# Patient Record
Sex: Female | Born: 1985 | Race: Black or African American | Hispanic: No | Marital: Married | Smoking: Current every day smoker
Health system: Southern US, Community
[De-identification: ages and names within clinical notes are randomized; demographics above are authoritative.]

## PROBLEM LIST (undated history)

## (undated) DIAGNOSIS — N83209 Unspecified ovarian cyst, unspecified side: Secondary | ICD-10-CM

## (undated) DIAGNOSIS — B977 Papillomavirus as the cause of diseases classified elsewhere: Secondary | ICD-10-CM

## (undated) DIAGNOSIS — R87629 Unspecified abnormal cytological findings in specimens from vagina: Secondary | ICD-10-CM

## (undated) HISTORY — DX: Papillomavirus as the cause of diseases classified elsewhere: B97.7

## (undated) HISTORY — PX: NO PAST SURGERIES: SHX2092

---

## 2012-02-28 ENCOUNTER — Ambulatory Visit: Payer: BC Managed Care – PPO

## 2012-02-28 ENCOUNTER — Ambulatory Visit (INDEPENDENT_AMBULATORY_CARE_PROVIDER_SITE_OTHER): Payer: BC Managed Care – PPO | Admitting: Family Medicine

## 2012-02-28 VITALS — BP 102/60 | HR 80 | Temp 98.1°F | Resp 16 | Ht 66.5 in | Wt 158.0 lb

## 2012-02-28 DIAGNOSIS — M25562 Pain in left knee: Secondary | ICD-10-CM

## 2012-02-28 DIAGNOSIS — M7632 Iliotibial band syndrome, left leg: Secondary | ICD-10-CM

## 2012-02-28 DIAGNOSIS — M25569 Pain in unspecified knee: Secondary | ICD-10-CM

## 2012-02-28 DIAGNOSIS — M629 Disorder of muscle, unspecified: Secondary | ICD-10-CM

## 2012-02-28 MED ORDER — KETOROLAC TROMETHAMINE 60 MG/2ML IM SOLN
60.0000 mg | Freq: Once | INTRAMUSCULAR | Status: AC
  Administered 2012-02-28: 60 mg via INTRAMUSCULAR

## 2012-02-28 NOTE — Progress Notes (Signed)
Subjective:   Rebecca Herring is a 26 y.o. female who presents with knee pain involving the left knee. Onset was recurrent over the last year . Inciting event: none known. Current symptoms include: pain located anterolateral knee and stiffness. Pain is aggravated by kneeling, squatting, standing and walking. Patient has had no prior knee problems. Evaluation to date: none. Treatment to date: OTC analgesics which are mildly to moderately effective .  The following portions of the patient's history were reviewed and updated as appropriate: allergies, current medications, past family history, past medical history, past social history, past surgical history and problem list.   Review of Systems Pertinent items are noted in HPI.   Objective:   BP 102/60  Pulse 80  Temp 98.1 F (36.7 C) (Oral)  Resp 16  Ht 5' 6.5" (1.689 m)  Wt 158 lb (71.668 kg)  BMI 25.12 kg/m2  SpO2 99%  LMP 02/26/2012 Gen: up in chair, NAD HEENT: NCAT, EOMI, TMs clear bilaterally CV: RRR, no murmurs auscultated PULM: CTAB, no wheezes, rales, rhoncii ABD: S/NT/+ bowel sounds  EXT: 2+ peripheral pulses  Right knee: normal and no effusion, full active range of motion, no joint line tenderness, ligamentous structures intact. Left knee:  Knee: Normal to inspection with no erythema or effusion or obvious bony abnormalities. + TTP across anterolateral knee, predominantly lateral knee. + Pain along iliotibial + pain with flexion and extension.  + pain radiating to L lateral knee with hip abduction.  Ligaments with solid consistent endpoints including ACL, PCL, LCL, MCL. Negative Mcmurray's and provocative meniscal tests. Non painful patellar compression. Patellar and quadriceps tendons unremarkable.   X-ray left knee: no fracture, dislocation, swelling or degenerative changes noted    Assessment:  R knee pain Iliotibial band syndrome.   Plan:   Sxs seem most consistent with iliotibial band syndrome. DDx included  patellofemoral syndrome.  Will place in knee brace. RICE and NSAIDs.  Plan for follow up with sports medicine.  Discussed general care.  Follow up as needed.     The patient and/or caregiver has been counseled thoroughly with regard to treatment plan and/or medications prescribed including dosage, schedule, interactions, rationale for use, and possible side effects and they verbalize understanding. Diagnoses and expected course of recovery discussed and will return if not improved as expected or if the condition worsens. Patient and/or caregiver verbalized understanding

## 2012-02-28 NOTE — Progress Notes (Deleted)
  Subjective:    Patient ID: Rebecca Herring, female    DOB: [birth year], 26 y.o.   MRN: [id number]  HPI    Review of Systems     Objective:   Physical Exam        Assessment & Plan:

## 2012-02-28 NOTE — Patient Instructions (Signed)
Iliotibial Band Syndrome  Iliotibial band syndrome is pain in the outer, upper thigh. The pain is due to an inflammation (soreness) of the iliotibial band. This is a band of thick fibrous tissue that runs down the outside of the thigh. The iliotibial band begins at the hip. It extends to the outer side of the shin bone (tibia) just below the knee joint. The band works with the thigh muscles. Together they provide stability to the outside of the knee joint.  Iliotibial band syndrome (ITBS) occurs when there is inflammation to this band of tissue. The irritation usually occurs over the outside of the knee joint, at the lateral epicondyle (the end of the femur bone.) The iliotibial band crosses bone and muscle at this point. Between these structures is a bursa (a cushioning sac). The bursa should make possible a smooth gliding motion. However, when inflamed, the iliotibial band does not glide easily. When inflamed, there is pain with motion of the knee. Usually the pain worsens with continued movement and the pain goes away with rest.  This problem usually arises when there is a sudden increase in sports activities involving the lower extremities (your legs). Running, and playing soccer or basketball are examples of activities causing this. Others who are prone to ITBS include individuals with mechanical problems such as leg length differences, abnormality of walking, bowed legs etc.  This diagnosis (learning what is wrong) is made by examination. X-rays are usually normal if only soft tissue inflammation is present.  Treatment of ITBS begins with proper footwear, icing the area of pain, stretching, and resting for a period of time. Incorporating low-impact cross-training activities may also help. Your caregiver may prescribe anti-inflammatory medications as well.  HOME CARE INSTRUCTIONS   · Apply ice to the sore area for 15 to 20 minutes, 3 to 4 times per day. Put the ice in a plastic bag and place a towel between the  bag of ice and your skin.  · Limit excessive training or eliminate training until pain goes away.  · While pain is present, you may use gentle range of motion. Do not resume regular use until instructed by your caregiver. Begin use gradually. Do not increase use to the point of pain. If pain does develop, decrease use and continue the above measures. Gradually increase activities that do not cause discomfort. Do this until you finally achieve normal use.  · Perform low impact activities while pain is present. Wear proper footwear.  · Only take over-the-counter or prescription medicines for pain, discomfort, or fever as directed by your caregiver.  SEEK MEDICAL CARE IF:   · Your pain increases or pain is not controlled with medications.  · You develop new, unexplained symptoms (problems), or an increase of the symptoms that brought you to your caregiver.  · Your pain and symptoms are not improving or are getting worse.  Document Released: 10/14/2001 Document Revised: 07/17/2011 Document Reviewed: 04/24/2005  [employer]® Patient Information ©2013 [employer].

## 2012-03-12 ENCOUNTER — Encounter: Payer: Self-pay | Admitting: Family Medicine

## 2012-03-12 ENCOUNTER — Ambulatory Visit (INDEPENDENT_AMBULATORY_CARE_PROVIDER_SITE_OTHER): Payer: BC Managed Care – PPO | Admitting: Family Medicine

## 2012-03-12 VITALS — BP 120/79 | HR 68 | Temp 98.2°F | Ht 66.0 in | Wt 159.1 lb

## 2012-03-12 DIAGNOSIS — M25569 Pain in unspecified knee: Secondary | ICD-10-CM

## 2012-03-12 NOTE — Progress Notes (Signed)
Patient ID: Rebecca Herring, female   DOB: 1986-02-27, 26 y.o.   MRN: [id number]  SUBJECTIVE: Rebecca Herring is a 26 y.o. female with unremarkable PMHx who presents today for left knee pain with referral from urgent care.  Seen there on 28-Feb-2012 and diagnosed with ITB syndrome vs patellofemoral syndrome.  Placed into immobilizer knee brace to wear at work or when bothering her and given prn oral NSAIDs, RICE treatment and referred to sports med.    Pt reports insidious onset of LEFT, anterior-lateral knee pain in approximately February 2013.  She previously ran on treadmill and used ellpitical for exercise approx 3-4 days/week but is currently not active secondary to pain / discomfort.  Pt reports insidious onset of pain and denies any trauma or distinct injury.  Though present since onset, within the last few months she has noticed progressively more common clicking and pain from the knee, specifically lateral aspect.  While at work she has had bouts of instability, causing her to catch herself on the bar or co-workers to prevent fall, and locking.  Reports edema in knee as well, usually worse at the end of long shifts, especially after consecutive days of work.   -- She finally decided to seek medical care @ urgent care after waking up with swollen knee and in a locked, painful position.  After gingerly using her hands to passively move her knee around she was finally able to get to walking position but still found flexion/extension quite difficult.  Denies any radicular symptoms, fever/chills/night sweats.  Denies any localized erythema, warmth on knee at any point.  Placed in knee immobilizer at urgent care which has been little/no help per report.  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  Typically works 3-5 consecutive days per week with Monday,  Tuesday, and sometimes Wednesday off Smokes 1/2 ppd since 26 years old Social EtOH use Monogamous with one partner; no illicit drug use   OBJECTIVE: Vital signs as noted above. Well-appearing female in no acute distress,   Knee exam: - No gross abnormalities on inspection; no erythema, edema about knee; cool to touch - AROM, PROM symmetric and appropriate in flexion, extension.  Hip abduction, adduction symmetric and appropriate - 5/5 strength in knee flexion and extension.  Some pain with resisted flexion but 5/5 strength on LEFT knee - Hip abduction and adduction, flexion and extension all 5/5 strength bilaterally; NO increased pain with resisted hip abduction - On palpation there is lateral joint line tenderness along the LEFT knee, worse at lateral, anterior joint line; no medial joint line tenderness on LEFT; none on RIGHT anywhere - Negative Lachman's, anterior / posterior drawer bilaterally - Negative Valgus stress testing bilaterally; however, during Valgus stress testing patient reports pain along lateral joint line, especially in 30 degrees knee flexion - Varus stress test has firm endpoint but is slightly lax bilaterally - McMurray's reveals bilateral patellar crepitus but negative for catching, clicking or locking though the patient reports pain on LEFT and has difficulty relaxing - Positive thessaly test on LEFT leg at 5 degrees, worse at 30 degrees with experience of popping, pain and pt reports she thinks it would lock/catch if she twisted to hard - Negative Ober's test bilaterally; negative thomas test  Left knee U/S: Lateral joint line visualized on musculoskeletal ultrasound in clinic.  Superficial lateral meniscus is visualized but there is  hypoechoic area surrounding lateral joint line, within knee capsule consistent with edema.  This edema is seen in longitudinal and trans views.  Medial joint line visualized and was without abnormality.  Patellar tendon visualized in  transverse and long views, without abnormality.  Suprapatellar pouch visualized in transverse view with no appreciable edema.  ASSESSMENT:  1. LEFT anterior-lateral knee pain, suspect meniscal injury  PLAN: 26 year old female with anterior lateral knee joint pain, swelling, popping, locking and instability that is affecting her ability to perform at work and her day to day function.  Denies any specific trauma or event and reports insidious onset with progressively worsening course developing into the above symptoms.  Physical exam suggestive of lateral meniscal injury vs. ITB syndrome though given characteristics of occasional instability, locking and edema in otherwise healthy young adult female with positive thessaly test, would like to proceed with MRI to rule out meniscal injury.   Pt had MRI scheduled in the office today and will go for imaging.  Someone from [facility] will call her back with MRI results.  MRI results influence follow-up and plan of care, would likely pursue surgical fixation if true meniscal injury. In interim patient was given body helix sleeve to wear over knee for support, comfort while at work and for 1 hour afterward.  Pt also instructed on how to perform, and had provider demonstration of various quadricept, hip flexor and knee extensor exercises to strengthen adjacent muscles and protect knee.  Patient did have x-rays done in urgent care with her reviewed by me today. Patient's x-rays show no gross abnormality and no sign of loose body.

## 2012-03-12 NOTE — Patient Instructions (Addendum)
Thank you for coming to the office today  - You were given a body helix knee sleeve.  Please wear this whenever you are at work for comfort and for 1 hour afterward to help keep edema out of your knee  - You were given a series of quadricep strengthening exercises to complete so you further strengthen your knee and protect that joint, improve function  - You will be called with your MRI appointment.  We will follow-up with you based on the results of your MRI.

## 2012-03-16 ENCOUNTER — Other Ambulatory Visit: Payer: BC Managed Care – PPO

## 2012-10-09 ENCOUNTER — Ambulatory Visit (INDEPENDENT_AMBULATORY_CARE_PROVIDER_SITE_OTHER): Payer: Managed Care, Other (non HMO) | Admitting: Family Medicine

## 2012-10-09 VITALS — BP 110/82 | HR 75 | Temp 98.2°F | Resp 16 | Ht 66.0 in | Wt 161.0 lb

## 2012-10-09 DIAGNOSIS — Z0000 Encounter for general adult medical examination without abnormal findings: Secondary | ICD-10-CM

## 2012-10-09 DIAGNOSIS — N898 Other specified noninflammatory disorders of vagina: Secondary | ICD-10-CM

## 2012-10-09 DIAGNOSIS — F17200 Nicotine dependence, unspecified, uncomplicated: Secondary | ICD-10-CM

## 2012-10-09 DIAGNOSIS — K649 Unspecified hemorrhoids: Secondary | ICD-10-CM

## 2012-10-09 DIAGNOSIS — A630 Anogenital (venereal) warts: Secondary | ICD-10-CM

## 2012-10-09 DIAGNOSIS — Z720 Tobacco use: Secondary | ICD-10-CM

## 2012-10-09 LAB — POCT CBC
Granulocyte percent: 44.1 % (ref 37–80)
HCT, POC: 41.6 % (ref 37.7–47.9)
Hemoglobin: 13.1 g/dL (ref 12.2–16.2)
Lymph, poc: 3 (ref 0.6–3.4)
MCH, POC: 30.8 pg (ref 27–31.2)
MCHC: 31.5 g/dL — AB (ref 31.8–35.4)
MCV: 97.9 fL — AB (ref 80–97)
MID (cbc): 0.5 (ref 0–0.9)
MPV: 8.9 fL (ref 0–99.8)
POC Granulocyte: 2.7 (ref 2–6.9)
POC LYMPH PERCENT: 48.4 %L (ref 10–50)
POC MID %: 7.5 %M (ref 0–12)
Platelet Count, POC: 262 10*3/uL (ref 142–424)
RBC: 4.25 M/uL (ref 4.04–5.48)
RDW, POC: 13.5 %
WBC: 6.1 10*3/uL (ref 4.6–10.2)

## 2012-10-09 LAB — COMPREHENSIVE METABOLIC PANEL
ALT: <8 U/L (ref 0–35)
Alkaline Phosphatase: 40 U/L (ref 39–117)
CO2: 25 mEq/L (ref 19–32)
Creat: 0.82 mg/dL (ref 0.50–1.10)
Glucose, Bld: 74 mg/dL (ref 70–99)
Sodium: 138 mEq/L (ref 135–145)
Total Bilirubin: 0.9 mg/dL (ref 0.3–1.2)
Total Protein: 6.9 g/dL (ref 6.0–8.3)

## 2012-10-09 LAB — COMPREHENSIVE METABOLIC PANEL WITH GFR
AST: 15 U/L (ref 0–37)
Albumin: 4.6 g/dL (ref 3.5–5.2)
BUN: 7 mg/dL (ref 6–23)
Calcium: 9.2 mg/dL (ref 8.4–10.5)
Chloride: 106 meq/L (ref 96–112)
Potassium: 4.2 meq/L (ref 3.5–5.3)

## 2012-10-09 LAB — POCT WET PREP WITH KOH
Clue Cells Wet Prep HPF POC: NEGATIVE
KOH Prep POC: NEGATIVE
Trichomonas, UA: NEGATIVE
Yeast Wet Prep HPF POC: NEGATIVE

## 2012-10-09 LAB — IFOBT (OCCULT BLOOD): IFOBT: NEGATIVE

## 2012-10-09 MED ORDER — VARENICLINE TARTRATE 0.5 MG X 11 & 1 MG X 42 PO MISC: ORAL | Status: AC

## 2012-10-09 MED ORDER — IMIQUIMOD 5 % EX CREA: TOPICAL_CREAM | CUTANEOUS | Status: DC

## 2012-10-09 NOTE — Patient Instructions (Signed)
Hemorrhoids Hemorrhoids are puffy (swollen) veins around the rectum or anus. Hemorrhoids can cause pain, itching, bleeding, or irritation. HOME CARE  Eat foods with fiber, such as whole grains, beans, nuts, fruits, and vegetables. Ask your doctor about taking products with added fiber in them (fibersupplements).  Drink enough fluid to keep your pee (urine) clear or pale yellow.  Exercise often.  Go to the bathroom when you have the urge to poop. Do not wait.  Avoid straining to poop (bowel movement).  Keep the butt area dry and clean. Use wet toilet paper or moist paper towels.  Medicated creams and medicine inserted into the anus (anal suppository) may be used or applied as told.  Only take medicine as told by your doctor.  Take a warm water bath (sitz bath) for 15 20 minutes to ease pain. Do this 3 4 times a day.  Place ice packs on the area if it is tender or puffy. Use the ice packs between the warm water baths.  Put ice in a plastic bag.  Place a towel between your skin and the bag.  Leave the ice on for 15-20 minutes, 3-4 times a day.  Do not use a donut-shaped pillow or sit on the toilet for a long time. GET HELP RIGHT AWAY IF:   You have more pain that is not controlled by treatment or medicine.  You have bleeding that will not stop.  You have trouble or are unable to poop (bowel movement).  You have pain or puffiness outside the area of the hemorrhoids. MAKE SURE YOU:   Understand these instructions.  Will watch your condition.  Will get help right away if you are not doing well or get worse. Document Released: 02/01/2008 Document Revised: 04/10/2012 Document Reviewed: 03/05/2012 [facility] Patient Information 2014 [place], [place]. Human Papillomavirus Human papillomavirus (HPV) is the most common sexually transmitted infection (STI) and is highly contagious. HPV infections cause genital warts and cancers to the outlet of the womb (cervix), birth canal  (vagina), opening of the birth canal (vulva), and anus. There are over 100 types of HPV. Four types of HPV are responsible for causing 70% of all cervical cancers. Ninety percent of anal cancers and genital warts are caused by HPV. Unless you have wart-like lesions in the throat or genital warts that you can see or feel, HPV usually does not cause symptoms. Therefore, people can be infected for long periods and pass it on to others without knowing it. HPV in pregnancy usually does not cause a problem for the mother or baby. If the mother has genital warts, the baby rarely gets infected. When the HPV infection is found to be pre-cancerous on the cervix, vagina, or vulva, the mother will be followed closely during the pregnancy. Any needed treatment will be done after the baby is born. CAUSES   Having unprotected sex. HPV can be spread by oral, vaginal, or anal sexual activity.  Having several sex partners.  Having a sex partner who has other sex partners.  Having or having had another sexually transmitted infection. SYMPTOMS   More than 90% of people carrying HPV cannot tell anything is wrong.  Wart-like lesions in the throat (from having oral sex).  Warts in the infected skin or mucous membranes.  Genital warts may itch, burn, or bleed.  Genital warts may be painful or bleed during sexual intercourse. DIAGNOSIS   Genital warts are easily seen with the naked eye.  Currently, there is no FDA-approved test to detect  HPV in males.  In females, a Pap test can show cells which are infected with HPV.  In females, a scope can be used to view the cervix (colposcopy). A colposcopy can be performed if the pelvic exam or Pap test is abnormal.  In females, a sample of tissue may be removed (biopsy) during the colposcopy. TREATMENT   Treatment of genital warts can include:  Podophyllin lotion or gel.  Bichloroacetic acid (BCA) or trichloroacetic acid (TCA).  Podofilox solution or  gel.  Imiquimod cream.  Interferon injections.  Use of a probe to apply extreme cold (cryotherapy).  Application of an intense beam of light (laser treatment).  Use of a probe to apply extreme heat (electrocautery).  Surgery.  HPV of the cervix, vagina, or vulva can be treated with:  Cryotherapy.  Laser treatment.  Electrocautery.  Surgery. Your caregiver will follow you closely after you are treated. This is because the HPV can come back and may need treatment again. HOME CARE INSTRUCTIONS   Follow your caregiver's instructions regarding medications, Pap tests, and follow-up exams.  Do not touch or scratch the warts.  Do not treat genital warts with medication used for treating hand warts.  Tell your sex partner about your infection because he or she may also need treatment.  Do not have sex while you are being treated.  After treatment, use condoms during sex to prevent future infections.  Have only 1 sex partner.  Have a sex partner who does not have other sex partners.  Use over-the-counter creams for itching or irritation as directed by your caregiver.  Use over-the-counter or prescription medicines for pain, discomfort, or fever as directed by your caregiver.  Do not douche or use tampons during treatment of HPV. PREVENTION   Talk to your caregiver about getting the HPV vaccines. These vaccines prevent some HPV infections and cancers. It is recommended that the vaccine be given to males and females between the age of 60 and 38 years old. It will not work if you already have HPV and it is not recommended for pregnant women. The vaccines are not recommended for pregnant women.  Call your caregiver if you think you are pregnant and have the HPV.  A PAP test is done to screen for cervical cancer.  The first PAP test should be done at age 52.  Between ages 22 and 42, PAP tests are repeated every 2 years.  Beginning at age 58, you are advised to have a PAP  test every 3 years as long as your past 3 PAP tests have been normal.  Some women have medical problems that increase the chance of getting cervical cancer. Talk to your caregiver about these problems. It is especially important to talk to your caregiver if a new problem develops soon after your last PAP test. In these cases, your caregiver may recommend more frequent screening and Pap tests.  The above recommendations are the same for women who have or have not gotten the vaccine for HPV (Human Papillomavirus).  If you had a hysterectomy for a problem that was not a cancer or a condition that could lead to cancer, then you no longer need Pap tests. However, even if you no longer need a PAP test, a regular exam is a good idea to make sure no other problems are starting.   If you are between ages 39 and 37, and you have had normal Pap tests going back 10 years, you no longer need Pap tests. However,  even if you no longer need a PAP test, a regular exam is a good idea to make sure no other problems are starting.  If you have had past treatment for cervical cancer or a condition that could lead to cancer, you need Pap tests and screening for cancer for at least 20 years after your treatment.  If Pap tests have been discontinued, risk factors (such as a new sexual partner)need to be re-assessed to determine if screening should be resumed.  Some women may need screenings more often if they are at high risk for cervical cancer. SEEK MEDICAL CARE IF:   The treated skin becomes red, swollen or painful.  You have an oral temperature above 102 F (38.9 C).  You feel generally ill.  You feel lumps or pimple-like projections in and around your genital area.  You develop bleeding of the vagina or the treatment area.  You develop painful sexual intercourse. Document Released: 07/15/2003 Document Revised: 07/17/2011 Document Reviewed: 07/04/2007 [facility] Patient Information 2014 [place],  [place].

## 2012-10-09 NOTE — Progress Notes (Signed)
[facility]:  Office Visit  Chief Complaint:  Chief Complaint  Patient presents with  . Rectal Bleeding    x 4 days, getting worse, no pain though  . Genital Warts    just has general questions regarding HPV warts    HPI: Rebecca Herring is a 27 y.o. female who complains of:  Would like annual exam-last annual May 2013, + abnormal pap for HPV, does not do SBE, no family h/o ovarian, uterine, breast cancer  1 month ago  She had swelling in her rectal area, no itching or burning. This past week she saw blood in her stool and also in the toilet. Yesterday she had dripping bright red blood in the toilet and stool and when she wiped on toilet paper. She states she does not have to strain but has constipation. No h/o fevers, chill, weight loss, diarrhea, night sweats, n/v/abd apin. Denies family h/o UC, IBS, colitis, colon cancer.   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 Husbnad has "molds on his scrotum and she has it around her vagina". G0, + HPV, LMP 09/26/12. Regular,Normal flow. No other STDs  Past Medical History  Diagnosis Date  . HPV (human papilloma virus) infection    History reviewed. No pertinent past surgical history. History   Social History  . Marital Status: Married    Spouse Name: N/A    Number of Children: N/A  . Years of Education: N/A   Social History Main Topics  . Smoking status: Current Every Day Smoker -- 0.50 packs/day    Types: Cigarettes  . Smokeless tobacco: None  . Alcohol Use: Yes  . Drug Use: No  . Sexually Active: None   Other Topics Concern  . None   Social History Narrative  . None   History reviewed. No pertinent family history. No Known Allergies Prior to Admission medications   Not on File     ROS: The patient denies fevers, chills, night sweats, unintentional weight loss, chest pain, palpitations, wheezing, dyspnea on exertion, nausea, vomiting, abdominal pain, dysuria, hematuria,  numbness,  weakness, or tingling. + rectal bleeding  All other systems have been reviewed and were otherwise negative with the exception of those mentioned in the HPI and as above.    PHYSICAL EXAM: Filed Vitals:   10/09/12 1426  BP: 110/82  Pulse: 75  Temp: 98.2 F (36.8 C)  Resp: 16   Filed Vitals:   10/09/12 1426  Height: 5\' 6"  (1.676 m)  Weight: 161 lb (73.029 kg)   Body mass index is 26 kg/(m^2).  General: Alert, no acute distress HEENT:  Normocephalic, atraumatic, oropharynx patent.  Cardiovascular:  Regular rate and rhythm, no rubs murmurs or gallops.  No Carotid bruits, radial pulse intact. No pedal edema.  Respiratory: Clear to auscultation bilaterally.  No wheezes, rales, or rhonchi.  No cyanosis, no use of accessory musculature GI: No organomegaly, abdomen is soft and non-tender, positive bowel sounds.  No masses. Skin: No rashes. Neurologic: Facial musculature symmetric. Psychiatric: Patient is appropriate throughout our interaction. Lymphatic: No cervical lymphadenopathy Musculoskeletal: Gait intact. Breast -normal GU-+ genital warts perivaginal, non on labia, + large external hemorrhoid no thrombosed, no rectal masses on lesions inside  rectal exam   LABS: Results for orders placed in visit on 10/09/12  POCT CBC      Result Value Range   WBC 6.1  4.6 - 10.2 K/uL   Lymph, poc 3.0  0.6 - 3.4  POC LYMPH PERCENT 48.4  10 - 50 %L   MID (cbc) 0.5  0 - 0.9   POC MID % 7.5  0 - 12 %M   POC Granulocyte 2.7  2 - 6.9   Granulocyte percent 44.1  37 - 80 %G   RBC 4.25  4.04 - 5.48 M/uL   Hemoglobin 13.1  12.2 - 16.2 g/dL   HCT, POC 16.1  09.6 - 47.9 %   MCV 97.9 (*) 80 - 97 fL   MCH, POC 30.8  27 - 31.2 pg   MCHC 31.5 (*) 31.8 - 35.4 g/dL   RDW, POC 04.5     Platelet Count, POC 262  142 - 424 K/uL   MPV 8.9  0 - 99.8 fL  POCT WET PREP WITH KOH      Result Value Range   Trichomonas, UA Negative     Clue Cells Wet Prep HPF POC neg     Epithelial Wet Prep HPF POC 8-15      Yeast Wet Prep HPF POC neg     Bacteria Wet Prep HPF POC 3+     RBC Wet Prep HPF POC 0-1     WBC Wet Prep HPF POC 2-6     KOH Prep POC Negative    IFOBT (OCCULT BLOOD)      Result Value Range   IFOBT Negative       EKG/XRAY:   Primary read interpreted by Dr. Conley Rolls at [facility].   ASSESSMENT/PLAN: Encounter Diagnoses  Name Primary?  . Annual physical exam Yes  . Vaginal venereal warts   . Vaginal discharge   . Hemorrhoids   . Tobacco abuse    Annual PE-labs pending + hemorrhoids-gave directions on hemorrhoid care Genital warts-Rx Aldara Tobacco cessation- Rx Chantix, Denies SI/HI Gross sideeffects, risk and benefits, and alternatives of medications d/w patient. Patient is aware that all medications have potential sideeffects and we are unable to predict every sideeffect or drug-drug interaction that may occur. F/u prn   Tran Randle PHUONG, DO 10/09/2012 3:52 PM

## 2012-10-11 LAB — PAP IG, CT-NG, RFX HPV ASCU
Chlamydia Probe Amp: NEGATIVE
GC Probe Amp: NEGATIVE

## 2012-10-14 ENCOUNTER — Other Ambulatory Visit: Payer: Self-pay | Admitting: Family Medicine

## 2012-10-14 ENCOUNTER — Telehealth: Payer: Self-pay | Admitting: Family Medicine

## 2012-10-14 DIAGNOSIS — R87619 Unspecified abnormal cytological findings in specimens from cervix uteri: Secondary | ICD-10-CM

## 2012-10-14 DIAGNOSIS — IMO0002 Reserved for concepts with insufficient information to code with codable children: Secondary | ICD-10-CM

## 2012-10-14 NOTE — Telephone Encounter (Signed)
LM to call me back. Would like to discuss her pap smear results.

## 2012-10-14 NOTE — Telephone Encounter (Signed)
D/w patient labs and pap. Refer to Ob/Gyn for abnormal pap

## 2013-01-08 ENCOUNTER — Ambulatory Visit: Payer: BC Managed Care – PPO | Admitting: Obstetrics

## 2013-05-10 IMAGING — CR DG KNEE COMPLETE 4+V*L*
4 series · 4 of 4 positions shown · non-contrast
Comparison: Preliminary reading Dr. Sanchez

CLINICAL DATA: Evaluate for degenerative changes

LEFT KNEE - COMPLETE 4+ VIEW

[AP]
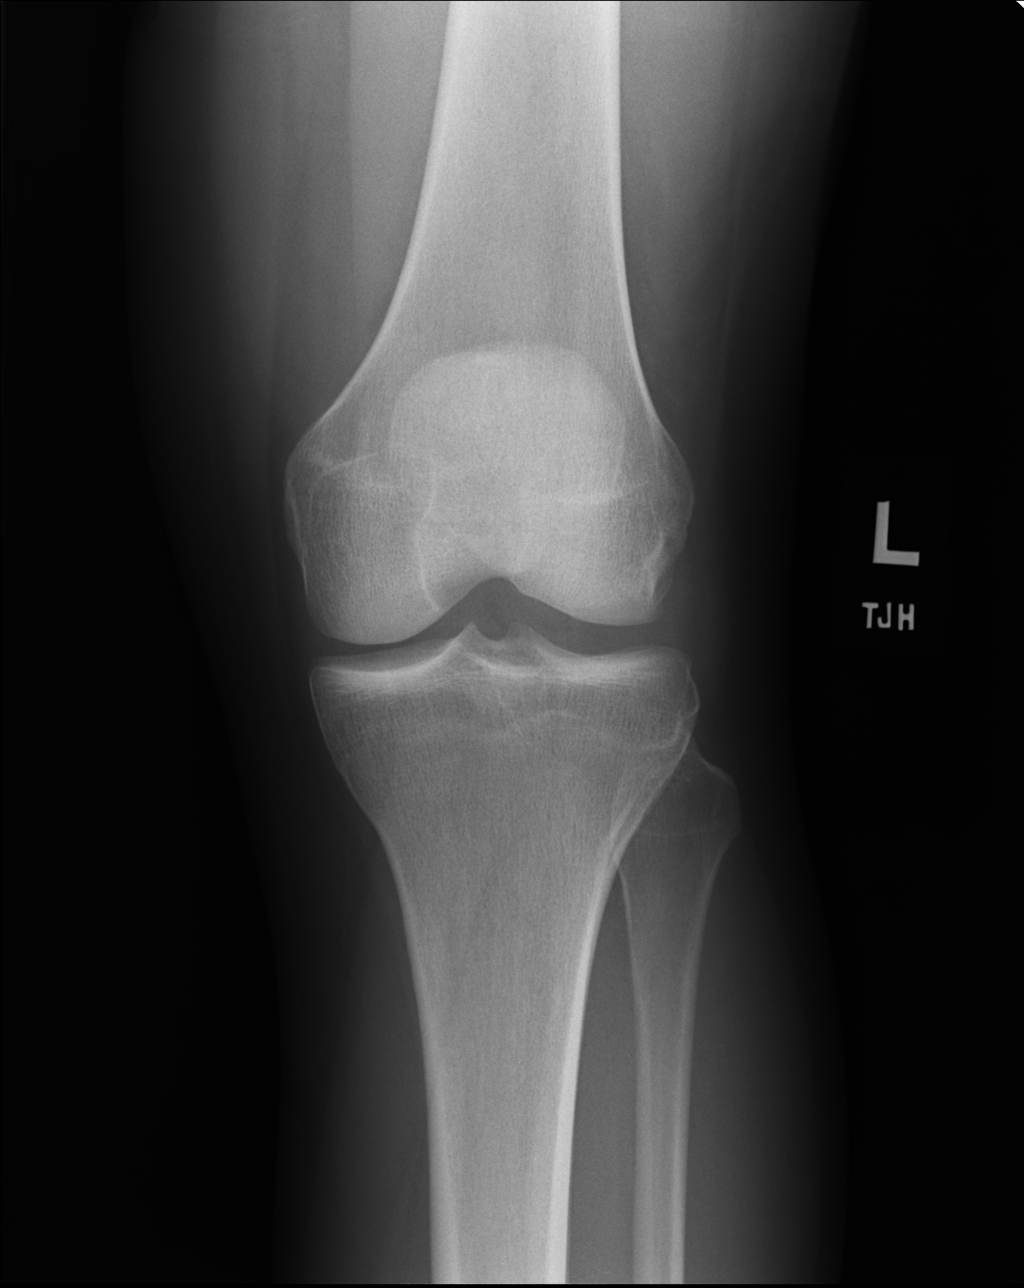

[lateral]
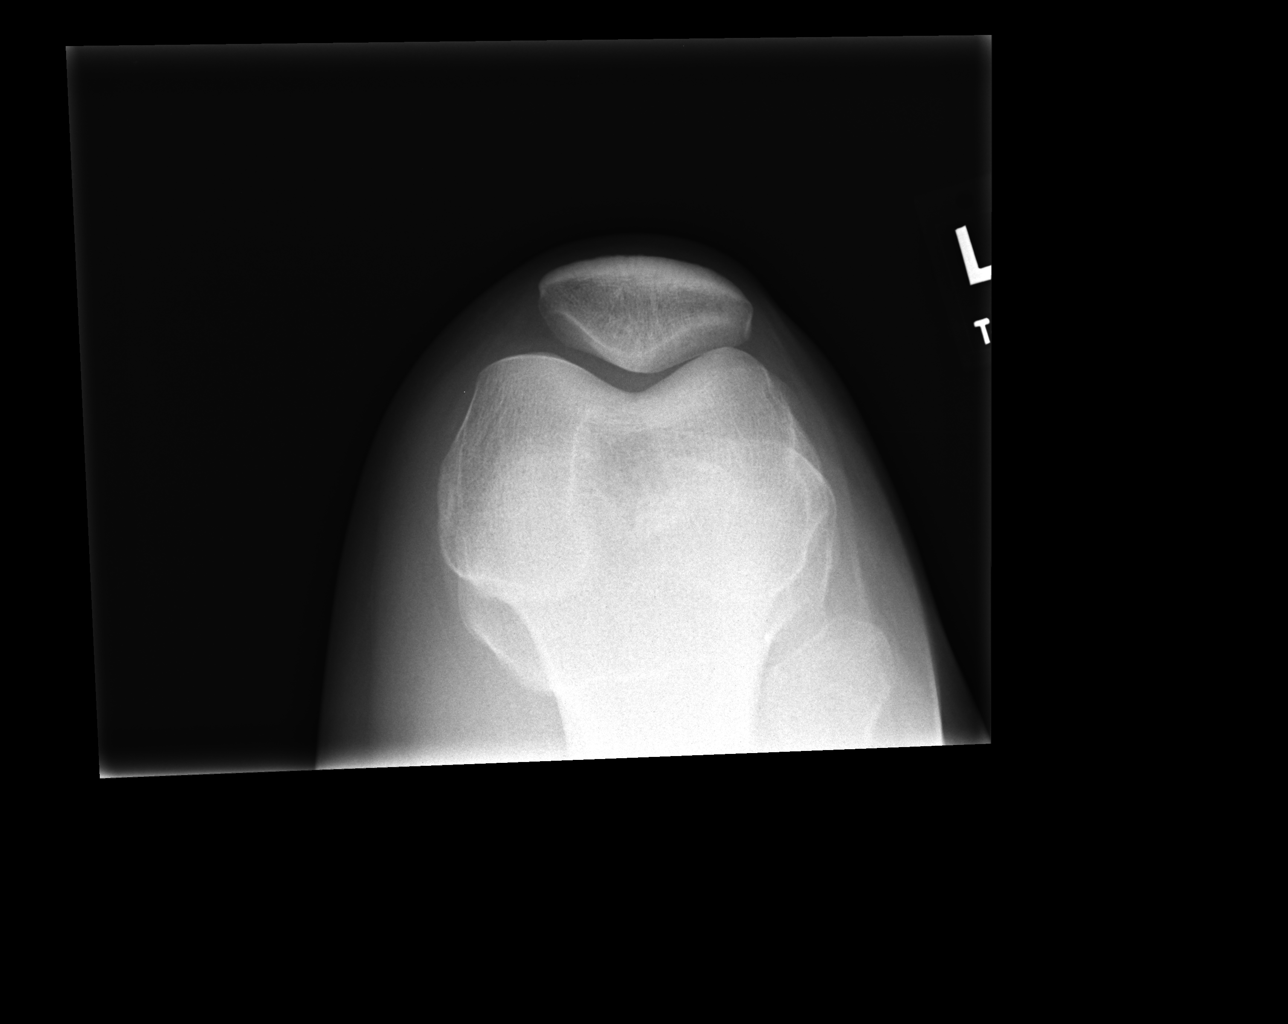

[ap ext rot]
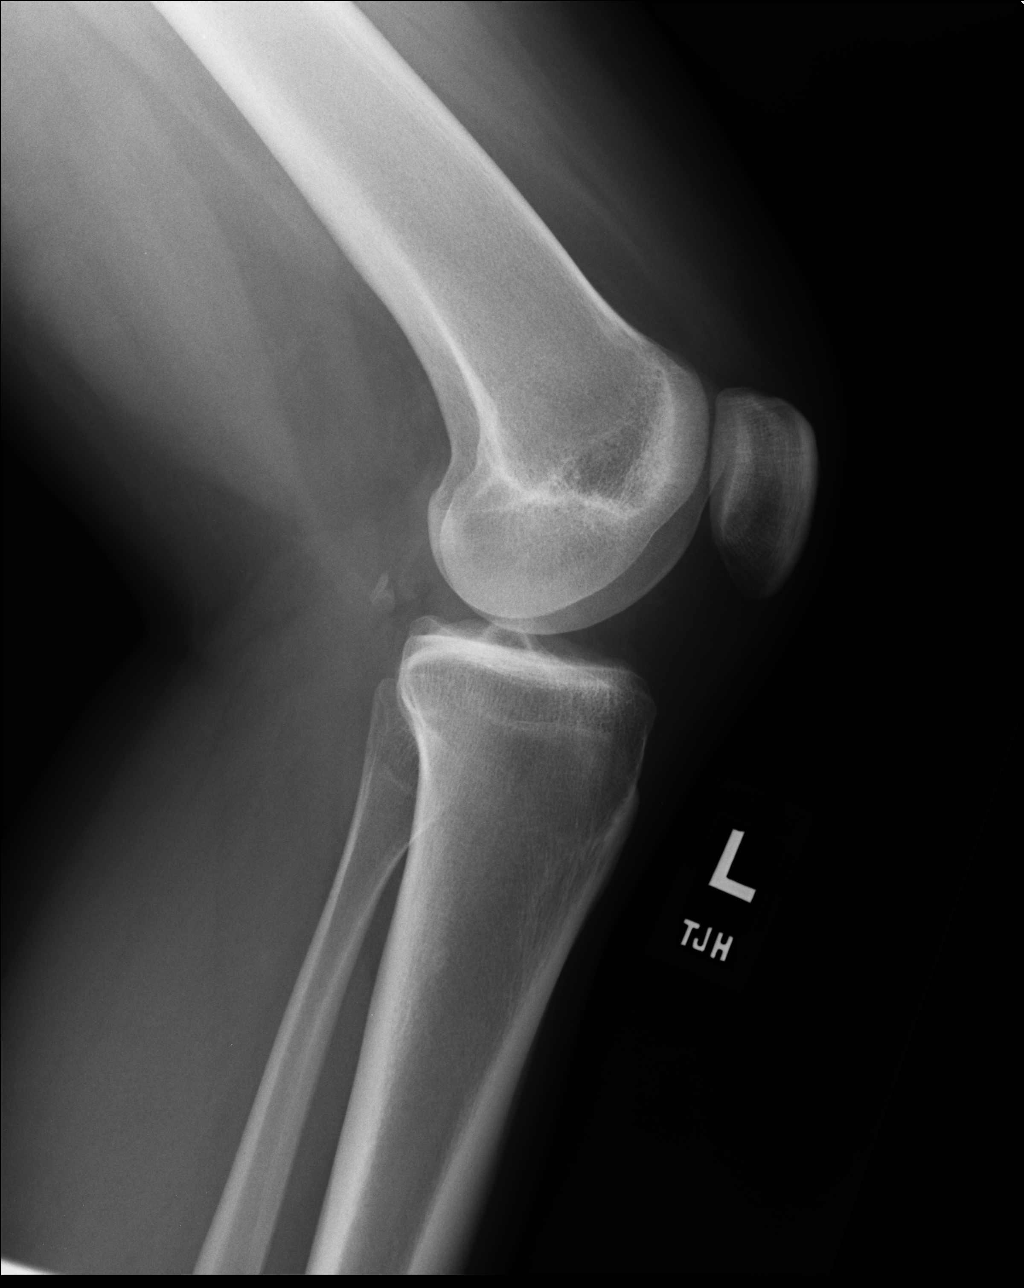

[ap int rot]
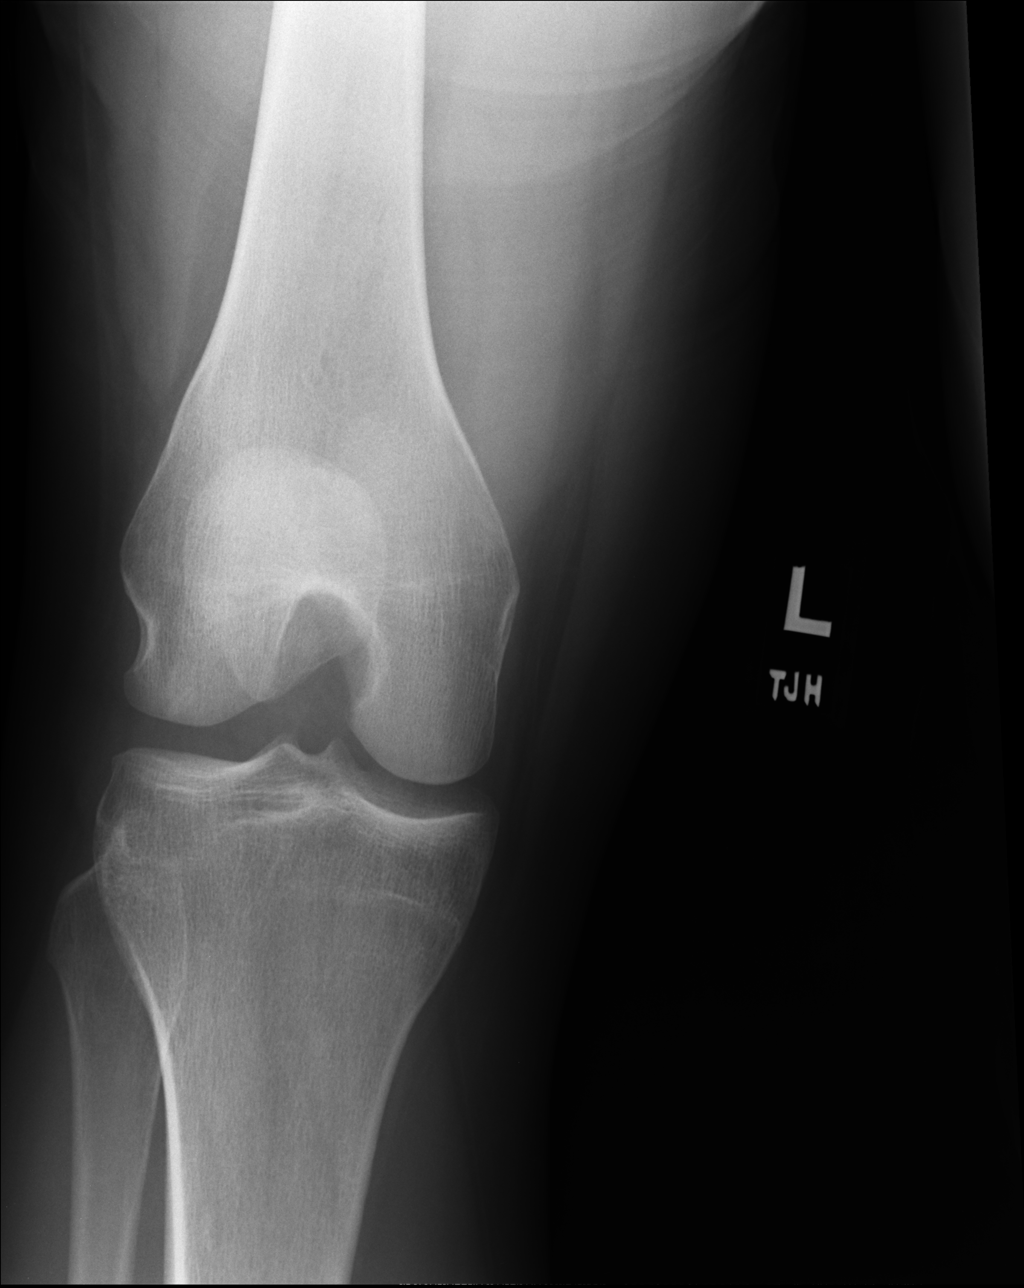

[4 of 4 positions shown; findings below may reference images not displayed]

FINDINGS: Four views of the left knee submitted.  No acute fracture
or subluxation.  No pathologic calcifications.  No joint effusion.
IMPRESSION: No acute fracture or subluxation.

## 2017-05-17 ENCOUNTER — Other Ambulatory Visit: Payer: Self-pay

## 2017-05-17 ENCOUNTER — Emergency Department (HOSPITAL_COMMUNITY): Payer: PRIVATE HEALTH INSURANCE

## 2017-05-17 ENCOUNTER — Emergency Department (HOSPITAL_COMMUNITY)
Admission: EM | Admit: 2017-05-17 | Discharge: 2017-05-17 | Disposition: A | Discharge status: Home / Self Care | Payer: PRIVATE HEALTH INSURANCE | Attending: Emergency Medicine | Admitting: Emergency Medicine

## 2017-05-17 ENCOUNTER — Encounter (HOSPITAL_COMMUNITY): Payer: Self-pay | Admitting: Emergency Medicine

## 2017-05-17 DIAGNOSIS — F17210 Nicotine dependence, cigarettes, uncomplicated: Secondary | ICD-10-CM | POA: Diagnosis not present

## 2017-05-17 DIAGNOSIS — Z3201 Encounter for pregnancy test, result positive: Secondary | ICD-10-CM | POA: Insufficient documentation

## 2017-05-17 DIAGNOSIS — O4690 Antepartum hemorrhage, unspecified, unspecified trimester: Secondary | ICD-10-CM

## 2017-05-17 DIAGNOSIS — O4691 Antepartum hemorrhage, unspecified, first trimester: Secondary | ICD-10-CM | POA: Insufficient documentation

## 2017-05-17 DIAGNOSIS — Z3A00 Weeks of gestation of pregnancy not specified: Secondary | ICD-10-CM | POA: Insufficient documentation

## 2017-05-17 DIAGNOSIS — O3680X0 Pregnancy with inconclusive fetal viability, not applicable or unspecified: Secondary | ICD-10-CM | POA: Insufficient documentation

## 2017-05-17 DIAGNOSIS — R102 Pelvic and perineal pain: Secondary | ICD-10-CM | POA: Insufficient documentation

## 2017-05-17 DIAGNOSIS — Z79899 Other long term (current) drug therapy: Secondary | ICD-10-CM | POA: Insufficient documentation

## 2017-05-17 LAB — I-STAT BETA HCG BLOOD, ED (MC, WL, AP ONLY): I-stat hCG, quantitative: 455.8 m[IU]/mL — ABNORMAL HIGH (ref <5)

## 2017-05-17 LAB — CBC
HEMATOCRIT: 36.5 % (ref 36.0–46.0)
HEMOGLOBIN: 11.9 g/dL — AB (ref 12.0–15.0)
MCH: 29.2 pg (ref 26.0–34.0)
MCHC: 32.6 g/dL (ref 30.0–36.0)
MCV: 89.5 fL (ref 78.0–100.0)
Platelets: 349 10*3/uL (ref 150–400)
RBC: 4.08 MIL/uL (ref 3.87–5.11)
RDW: 13.1 % (ref 11.5–15.5)
WBC: 6.1 10*3/uL (ref 4.0–10.5)

## 2017-05-17 NOTE — ED Notes (Signed)
Pt stable and ambulatory for d/c states understanding follow up 

## 2017-05-17 NOTE — Discharge Instructions (Signed)
[facility] for [facility] pregnancy hormone testing in 72 hours[facility] or worsening [facility].

## 2017-05-17 NOTE — ED Notes (Signed)
Pt to be taken to E41 from [place]

## 2017-05-17 NOTE — ED Notes (Signed)
Unable to locate for room x1 

## 2017-05-17 NOTE — ED Triage Notes (Signed)
Pt reports irregular vaginal bleeding. Strong menstrual cramps and back pain. Pt c/o pelvic pain primarily on the right side.  Denies recent fever. Pt also states bleeding hemorrhoids. Notes fatigue, feeling tired. VSS.

## 2017-05-19 NOTE — ED Provider Notes (Signed)
[facility] EMERGENCY DEPARTMENT Provider Note   CSN: [id number] Arrival date & time: 05/17/17  1339     History   Chief Complaint Chief Complaint  Patient presents with  . Vaginal Bleeding    HPI Rebecca Herring is a 32 y.o. female.  HPI Patient is a 32 year old female who presents to the emergency department with irregular vaginal bleeding.  She reports lower abdominal cramping and back pain as well as mild right lower pelvic pain.  No vaginal discharge.  Last normal menstrual cycle was 2 weeks ago.  She reports feeling somewhat weak without lightheadedness.  No syncope.  No chest pain shortness of breath.  No fevers or chills.  No urinary symptoms.  Patient has never been pregnant.  Patient is a positive pregnancy test here in the emergency department  Past Medical History:  Diagnosis Date  . HPV (human papilloma virus) infection     There are no active problems to display for this patient.   History reviewed. No pertinent surgical history.  OB History    No data available       Home Medications    Prior to Admission medications   Medication Sig Start Date End Date Taking? Authorizing Provider  ibuprofen (ADVIL,MOTRIN) 200 MG tablet Take 200-800 mg by mouth every 6 (six) hours as needed (for pain or headaches).   Yes [provider]  imiquimod (ALDARA) 5 % cream Apply topically 3 (three) times a week. Use for a total of 8 wekks Patient not taking: Reported on 05/17/2017 10/09/12   Le, Thao P, DO  varenicline (CHANTIX STARTING MONTH PAK) 0.5 MG X 11 & 1 MG X 42 tablet Take one 0.5 mg tablet by mouth once daily for 3 days, then increase to one 0.5 mg tablet twice daily for 4 days, then increase to one 1 mg tablet twice daily. If need refill please call. Patient not taking: Reported on 05/17/2017 10/09/12   Lenell Antu, DO    Family History History reviewed. No pertinent family history.  Social History Social History   Tobacco Use  . Smoking  status: Current Every Day Smoker    Packs/day: 0.50    Types: Cigarettes  Substance Use Topics  . Alcohol use: Yes  . Drug use: No     Allergies   Patient has no known allergies.   Review of Systems Review of Systems  All other systems reviewed and are negative.    Physical Exam Updated Vital Signs BP 116/77 (BP Location: Left Arm)   Pulse 98   Temp 98.3 F (36.8 C) (Oral)   Resp 18   LMP 05/06/2017   SpO2 100%   Physical Exam  Constitutional: She is oriented to person, place, and time. She appears well-developed and well-nourished.  HENT:  Head: Normocephalic.  Eyes: EOM are normal.  Neck: Normal range of motion.  Cardiovascular: Normal rate.  Pulmonary/Chest: Effort normal and breath sounds normal.  Abdominal: Soft. She exhibits no distension. There is no tenderness.  Musculoskeletal: Normal range of motion.  Neurological: She is alert and oriented to person, place, and time.  Psychiatric: She has a normal mood and affect.  Nursing note and vitals reviewed.    ED Treatments / Results  Labs (all labs ordered are listed, but only abnormal results are displayed) Labs Reviewed  CBC - Abnormal; Notable for the following components:      Result Value   Hemoglobin 11.9 (*)    All other components within normal  limits  I-STAT BETA HCG BLOOD, ED ([facility], [facility], [facility] ONLY) - Abnormal; Notable for the following components:   I-stat hCG, quantitative 455.8 (*)    All other components within normal limits    EKG  EKG Interpretation None       Radiology [place] Ob Comp < 14 Wks  Result Date: 05/17/2017 CLINICAL DATA:  Irregular bleeding with positive pregnancy test. Beta HCG 455 EXAM: OBSTETRIC <14 WK US AND TRANSVAGINAL OB US TECHNIQUE: Both transabdominal and transvaginal ultrasound examinations were performed for complete evaluation of the gestation as well as the maternal uterus, adnexal regions, and pelvic cul-de-sac. Transvaginal technique was performed to assess early  pregnancy. COMPARISON:  None. FINDINGS: Intrauterine gestational sac: None Yolk sac:  Not Visualized. Embryo:  Not Visualized. Cardiac Activity: Not Visualized. Heart Rate: Not applicable Subchorionic hemorrhage:  None visualized. Maternal uterus/adnexae: Small amount of free fluid in the cul-de-sac. Hypoechoic thinly septated hemorrhagic appearing cyst measuring 1.8 x 1.4 x 2.1 cm is noted with peripheral vascularity. An additional thinly septated complex cyst of the right ovary measuring 2.2 x 1.5 x 2.3 cm identified. A follicle is noted of the left ovary measuring 1.8 x 1.5 x 1.7 cm. No ectopic pregnancy is seen. IMPRESSION: 1. No intrauterine or ectopic pregnancy is identified. Findings would suggest a pregnancy of unknown origin given positive pregnancy test. Serial HCG and repeat imaging as needed is suggested. 2. Complex right ovarian cysts 1 which appears to be hemorrhagic in appearance measuring 1.8 x 1.4 x 2.1 cm. A thinly septated 2.2 x 1.5 x 2.3 cm right ovarian cyst is also seen without suspicious features. 3. Small amount of free fluid is noted in the pelvis. Electronically Signed   By: Tollie Ethavid  Kwon M.D.   On: 05/17/2017 20:36   [place] Ob Transvaginal  Result Date: 05/17/2017 CLINICAL DATA:  Irregular bleeding with positive pregnancy test. Beta HCG 455 EXAM: OBSTETRIC <14 WK US AND TRANSVAGINAL OB US TECHNIQUE: Both transabdominal and transvaginal ultrasound examinations were performed for complete evaluation of the gestation as well as the maternal uterus, adnexal regions, and pelvic cul-de-sac. Transvaginal technique was performed to assess early pregnancy. COMPARISON:  None. FINDINGS: Intrauterine gestational sac: None Yolk sac:  Not Visualized. Embryo:  Not Visualized. Cardiac Activity: Not Visualized. Heart Rate: Not applicable Subchorionic hemorrhage:  None visualized. Maternal uterus/adnexae: Small amount of free fluid in the cul-de-sac. Hypoechoic thinly septated hemorrhagic appearing cyst  measuring 1.8 x 1.4 x 2.1 cm is noted with peripheral vascularity. An additional thinly septated complex cyst of the right ovary measuring 2.2 x 1.5 x 2.3 cm identified. A follicle is noted of the left ovary measuring 1.8 x 1.5 x 1.7 cm. No ectopic pregnancy is seen. IMPRESSION: 1. No intrauterine or ectopic pregnancy is identified. Findings would suggest a pregnancy of unknown origin given positive pregnancy test. Serial HCG and repeat imaging as needed is suggested. 2. Complex right ovarian cysts 1 which appears to be hemorrhagic in appearance measuring 1.8 x 1.4 x 2.1 cm. A thinly septated 2.2 x 1.5 x 2.3 cm right ovarian cyst is also seen without suspicious features. 3. Small amount of free fluid is noted in the pelvis. Electronically Signed   By: Tollie Ethavid  Kwon M.D.   On: 05/17/2017 20:36    Procedures Procedures (including critical care time)  Medications Ordered in ED Medications - No data to display   Initial Impression / Assessment and Plan / ED Course  I have reviewed the triage vital signs and the nursing  notes.  Pertinent labs & imaging results that were available during my care of the patient were reviewed by me and considered in my medical decision making (see chart for details).     Patient reports vaginal bleeding is slowing down.  Cramping is easing off.  Pregnancy test is positive.  Ultrasound is nonspecific but no signs of ectopic are noted.  Vital signs are stable.  Ectopic precautions given.  Patient will need repeat beta-hCG quant in 48-72 hours.  She understands to return to [facility] for any new or worsening symptoms.  Stable for discharge from the emergency department.  She has a gynecologist.  I recommended that she call the office in the morning for additional recommendations.  Final Clinical Impressions(s) / ED Diagnoses   Final diagnoses:  Vaginal bleeding in pregnancy    ED Discharge Orders    None       Azalia Bilis, MD 05/19/17 [phone]

## 2017-05-21 ENCOUNTER — Ambulatory Visit (INDEPENDENT_AMBULATORY_CARE_PROVIDER_SITE_OTHER): Payer: PRIVATE HEALTH INSURANCE | Admitting: General Practice

## 2017-05-21 DIAGNOSIS — Z3490 Encounter for supervision of normal pregnancy, unspecified, unspecified trimester: Secondary | ICD-10-CM

## 2017-05-21 DIAGNOSIS — O3680X0 Pregnancy with inconclusive fetal viability, not applicable or unspecified: Secondary | ICD-10-CM

## 2017-05-21 LAB — HCG, QUANTITATIVE, PREGNANCY: HCG, BETA CHAIN, QUANT, S: 564 m[IU]/mL — AB (ref <5)

## 2017-05-21 NOTE — Progress Notes (Signed)
Patient here for stat bhcg today. Patient reports continued dark bleeding like a period but it comes off and on. Patient reports normal LMP 05/06/17. Patient denies pain. Discussed with patient waiting in lobby for results & updated plan of care. Patient verbalized understanding & has no questions at this time.   Reviewed results with Dr Vergie LivingPickens, who states patient needs follow up bhcg in 48 hours and ABO/RH today. Informed patient of results today & need for repeat in 48 hours. Discussed returning to [facility] for severe pain or heavy bleeding. Patient verbalized understanding and had no questions

## 2017-05-22 LAB — ABO/RH: Rh Factor: POSITIVE

## 2017-05-23 ENCOUNTER — Ambulatory Visit: Payer: PRIVATE HEALTH INSURANCE | Admitting: General Practice

## 2017-05-23 DIAGNOSIS — O3680X0 Pregnancy with inconclusive fetal viability, not applicable or unspecified: Secondary | ICD-10-CM

## 2017-05-23 LAB — HCG, QUANTITATIVE, PREGNANCY: hCG, Beta Chain, Quant, S: 518 m[IU]/mL — ABNORMAL HIGH (ref <5)

## 2017-05-23 NOTE — Progress Notes (Addendum)
Patient here for stat bhcg today. Patient denies pain, but reports some spotting. Discussed with patient we are checking her pregnancy hormone, bhcg levels today. Asked she wait in lobby for results and updated plan of care. Patient verbalized understanding & has no questions at this time.  Reviewed results with Dr Debroah LoopArnold who states bhcg are slightly trending down, SAB possible- but patient should return on 1/18 for confirmation.   Informed patient of results & need for follow up. Patient appropriately upset at possibility of SAB. Patient is aware to follow up on Friday for repeat stat bhcg.

## 2017-05-25 ENCOUNTER — Encounter (HOSPITAL_COMMUNITY): Payer: Self-pay | Admitting: *Deleted

## 2017-05-25 ENCOUNTER — Inpatient Hospital Stay (HOSPITAL_COMMUNITY)
Admission: AD | Admit: 2017-05-25 | Discharge: 2017-05-25 | Disposition: A | Discharge status: Home / Self Care | Payer: PRIVATE HEALTH INSURANCE | Source: Ambulatory Visit | Attending: Obstetrics and Gynecology | Admitting: Obstetrics and Gynecology

## 2017-05-25 ENCOUNTER — Ambulatory Visit: Payer: PRIVATE HEALTH INSURANCE | Admitting: *Deleted

## 2017-05-25 ENCOUNTER — Other Ambulatory Visit: Payer: Self-pay

## 2017-05-25 DIAGNOSIS — O00101 Right tubal pregnancy without intrauterine pregnancy: Secondary | ICD-10-CM | POA: Diagnosis not present

## 2017-05-25 DIAGNOSIS — F17210 Nicotine dependence, cigarettes, uncomplicated: Secondary | ICD-10-CM | POA: Insufficient documentation

## 2017-05-25 DIAGNOSIS — O00109 Unspecified tubal pregnancy without intrauterine pregnancy: Secondary | ICD-10-CM | POA: Diagnosis not present

## 2017-05-25 DIAGNOSIS — O99331 Smoking (tobacco) complicating pregnancy, first trimester: Secondary | ICD-10-CM | POA: Insufficient documentation

## 2017-05-25 DIAGNOSIS — O3481 Maternal care for other abnormalities of pelvic organs, first trimester: Secondary | ICD-10-CM | POA: Insufficient documentation

## 2017-05-25 DIAGNOSIS — N83209 Unspecified ovarian cyst, unspecified side: Secondary | ICD-10-CM | POA: Diagnosis not present

## 2017-05-25 DIAGNOSIS — R109 Unspecified abdominal pain: Secondary | ICD-10-CM | POA: Diagnosis present

## 2017-05-25 DIAGNOSIS — O3680X0 Pregnancy with inconclusive fetal viability, not applicable or unspecified: Secondary | ICD-10-CM

## 2017-05-25 DIAGNOSIS — Z3A01 Less than 8 weeks gestation of pregnancy: Secondary | ICD-10-CM | POA: Diagnosis not present

## 2017-05-25 HISTORY — DX: Unspecified ovarian cyst, unspecified side: N83.209

## 2017-05-25 HISTORY — DX: Unspecified abnormal cytological findings in specimens from vagina: R87.629

## 2017-05-25 LAB — CBC WITH DIFFERENTIAL/PLATELET
Basophils Absolute: 0 10*3/uL (ref 0.0–0.1)
Basophils Relative: 1 %
Eosinophils Absolute: 0.1 10*3/uL (ref 0.0–0.7)
Eosinophils Relative: 1 %
HCT: 36.5 % (ref 36.0–46.0)
Hemoglobin: 12.5 g/dL (ref 12.0–15.0)
Lymphocytes Relative: 39 %
Lymphs Abs: 2.2 10*3/uL (ref 0.7–4.0)
MCH: 30.7 pg (ref 26.0–34.0)
MCHC: 34.2 g/dL (ref 30.0–36.0)
MCV: 89.7 fL (ref 78.0–100.0)
Monocytes Absolute: 0.3 10*3/uL (ref 0.1–1.0)
Monocytes Relative: 6 %
Neutro Abs: 2.9 10*3/uL (ref 1.7–7.7)
Neutrophils Relative %: 53 %
Platelets: 374 10*3/uL (ref 150–400)
RBC: 4.07 MIL/uL (ref 3.87–5.11)
RDW: 13.8 % (ref 11.5–15.5)
WBC: 5.5 10*3/uL (ref 4.0–10.5)

## 2017-05-25 LAB — CREATININE, SERUM
Creatinine, Ser: 0.78 mg/dL (ref 0.44–1.00)
GFR calc Af Amer: >60 mL/min (ref >60)
GFR calc non Af Amer: >60 mL/min (ref >60)

## 2017-05-25 LAB — BUN: BUN: 10 mg/dL (ref 6–20)

## 2017-05-25 LAB — TYPE AND SCREEN
ABO/RH(D): A POS
Antibody Screen: NEGATIVE

## 2017-05-25 LAB — HCG, QUANTITATIVE, PREGNANCY: HCG, BETA CHAIN, QUANT, S: 539 m[IU]/mL — AB (ref <5)

## 2017-05-25 LAB — AST: AST: 30 U/L (ref 15–41)

## 2017-05-25 LAB — ABO/RH: ABO/RH(D): A POS

## 2017-05-25 MED ORDER — METHOTREXATE INJECTION FOR WOMEN'S HOSPITAL
50.0000 mg/m2 | Freq: Once | INTRAMUSCULAR | Status: AC
Start: 2017-05-25 — End: 2017-05-25
  Administered 2017-05-25: 90 mg via INTRAMUSCULAR
  Filled 2017-05-25: qty 1.8

## 2017-05-25 NOTE — MAU Provider Note (Signed)
Chief Complaint: for methotrexate   First Provider Initiated Contact with Patient 05/25/17 1229      SUBJECTIVE HPI: Rebecca Herring is a 32 y.o. G1P0 at [redacted]w[redacted]d by LMP who presents to maternity admissions for MTX. Patient was sent up from the clinic this morning following repeat BHCG. She reports going to  ED last week for abdominal pain and bleeding, where she found out she was pregnant. She currently denies abdominal pain or bleeding. She denies vaginal itching/burning, urinary symptoms, h/a, dizziness, n/v, or fever/chills.    Past Medical History:  Diagnosis Date  . HPV (human papilloma virus) infection   . Ovarian cyst   . Vaginal Pap smear, abnormal    dx with HPV   Past Surgical History:  Procedure Laterality Date  . NO PAST SURGERIES     Social History   Socioeconomic History  . Marital status: Married    Spouse name: Not on file  . Number of children: Not on file  . Years of education: Not on file  . Highest education level: Not on file  Social Needs  . Financial resource strain: Not on file  . Food insecurity - worry: Not on file  . Food insecurity - inability: Not on file  . Transportation needs - medical: Not on file  . Transportation needs - non-medical: Not on file  Occupational History  . Not on file  Tobacco Use  . Smoking status: Current Every Day Smoker    Packs/day: 0.50    Years: 10.00    Pack years: 5.00    Types: Cigarettes  . Smokeless tobacco: Never Used  Substance and Sexual Activity  . Alcohol use: Yes  . Drug use: No  . Sexual activity: Not on file  Other Topics Concern  . Not on file  Social History Narrative  . Not on file   No current facility-administered medications on file prior to encounter.    Current Outpatient Medications on File Prior to Encounter  Medication Sig Dispense Refill  . ibuprofen (ADVIL,MOTRIN) 200 MG tablet Take 200-800 mg by mouth every 6 (six) hours as needed (for pain or headaches).    . imiquimod  (ALDARA) 5 % cream Apply topically 3 (three) times a week. Use for a total of 8 wekks (Patient not taking: Reported on 05/17/2017) 12 each 1  . varenicline (CHANTIX STARTING MONTH PAK) 0.5 MG X 11 & 1 MG X 42 tablet Take one 0.5 mg tablet by mouth once daily for 3 days, then increase to one 0.5 mg tablet twice daily for 4 days, then increase to one 1 mg tablet twice daily. If need refill please call. (Patient not taking: Reported on 05/17/2017) 53 tablet 0   No Known Allergies  ROS:  Review of Systems  Constitutional: Negative.   Respiratory: Negative.   Cardiovascular: Negative.   Gastrointestinal: Negative.   Genitourinary: Negative.   Musculoskeletal: Negative.   Neurological: Negative.   Psychiatric/Behavioral: Negative.    I have reviewed patient's Past Medical Hx, Surgical Hx, Family Hx, Social Hx, medications and allergies.   Physical Exam   Patient Vitals for the past 24 hrs:  BP Temp Temp src Pulse Resp SpO2 Height Weight  05/25/17 1141 114/72 97.6 F (36.4 C) Oral 76 16 100 % - -  05/25/17 1140 - - - - - - 5\' 6"  (1.676 m) 146 lb (66.2 kg)   Medical screening complete: Constitutional: Well-developed, well-nourished female in no acute distress.  Cardiovascular: normal rate Respiratory: normal effort  GI: Abd soft, non-tender. Pos BS x 4 MS: Extremities nontender, no edema, normal ROM Neurologic: Alert and oriented x 4.  GU: Neg CVAT.  LAB RESULTS Results for orders placed or performed during the hospital encounter of 05/25/17 (from the past 24 hour(s))  CBC WITH DIFFERENTIAL     Status: None   Collection Time: 05/25/17 11:45 AM  Result Value Ref Range   WBC 5.5 4.0 - 10.5 K/uL   RBC 4.07 3.87 - 5.11 MIL/uL   Hemoglobin 12.5 12.0 - 15.0 g/dL   HCT 16.1 09.6 - 04.5 %   MCV 89.7 78.0 - 100.0 fL   MCH 30.7 26.0 - 34.0 pg   MCHC 34.2 30.0 - 36.0 g/dL   RDW 40.9 81.1 - 91.4 %   Platelets 374 150 - 400 K/uL   Neutrophils Relative % 53 %   Neutro Abs 2.9 1.7 - 7.7 K/uL    Lymphocytes Relative 39 %   Lymphs Abs 2.2 0.7 - 4.0 K/uL   Monocytes Relative 6 %   Monocytes Absolute 0.3 0.1 - 1.0 K/uL   Eosinophils Relative 1 %   Eosinophils Absolute 0.1 0.0 - 0.7 K/uL   Basophils Relative 1 %   Basophils Absolute 0.0 0.0 - 0.1 K/uL  BUN     Status: None   Collection Time: 05/25/17 11:45 AM  Result Value Ref Range   BUN 10 6 - 20 mg/dL  AST     Status: None   Collection Time: 05/25/17 11:45 AM  Result Value Ref Range   AST 30 15 - 41 U/L  Creatinine, serum     Status: None   Collection Time: 05/25/17 11:45 AM  Result Value Ref Range   Creatinine, Ser 0.78 0.44 - 1.00 mg/dL   GFR calc non Af Amer >60 >60 mL/min   GFR calc Af Amer >60 >60 mL/min  Type and screen [facility]     Status: None   Collection Time: 05/25/17 11:46 AM  Result Value Ref Range   ABO/RH(D) A POS    Antibody Screen NEG    Sample Expiration 05/28/2017     A/Positive/-- (01/14 1335)  IMAGING US Ob Comp < 14 Wks  Result Date: 05/17/2017 CLINICAL DATA:  Irregular bleeding with positive pregnancy test. Beta HCG 455 EXAM: OBSTETRIC <14 WK [place] AND TRANSVAGINAL OB US TECHNIQUE: Both transabdominal and transvaginal ultrasound examinations were performed for complete evaluation of the gestation as well as the maternal uterus, adnexal regions, and pelvic cul-de-sac. Transvaginal technique was performed to assess early pregnancy. COMPARISON:  None. FINDINGS: Intrauterine gestational sac: None Yolk sac:  Not Visualized. Embryo:  Not Visualized. Cardiac Activity: Not Visualized. Heart Rate: Not applicable Subchorionic hemorrhage:  None visualized. Maternal uterus/adnexae: Small amount of free fluid in the cul-de-sac. Hypoechoic thinly septated hemorrhagic appearing cyst measuring 1.8 x 1.4 x 2.1 cm is noted with peripheral vascularity. An additional thinly septated complex cyst of the right ovary measuring 2.2 x 1.5 x 2.3 cm identified. A follicle is noted of the left ovary  measuring 1.8 x 1.5 x 1.7 cm. No ectopic pregnancy is seen. IMPRESSION: 1. No intrauterine or ectopic pregnancy is identified. Findings would suggest a pregnancy of unknown origin given positive pregnancy test. Serial HCG and repeat imaging as needed is suggested. 2. Complex right ovarian cysts 1 which appears to be hemorrhagic in appearance measuring 1.8 x 1.4 x 2.1 cm. A thinly septated 2.2 x 1.5 x 2.3 cm right ovarian cyst is also seen  without suspicious features. 3. Small amount of free fluid is noted in the pelvis. Electronically Signed   By: Tollie Ethavid  Kwon M.D.   On: 05/17/2017 20:36   [place] Ob Transvaginal  Result Date: 05/17/2017 CLINICAL DATA:  Irregular bleeding with positive pregnancy test. Beta HCG 455 EXAM: OBSTETRIC <14 WK US AND TRANSVAGINAL OB US TECHNIQUE: Both transabdominal and transvaginal ultrasound examinations were performed for complete evaluation of the gestation as well as the maternal uterus, adnexal regions, and pelvic cul-de-sac. Transvaginal technique was performed to assess early pregnancy. COMPARISON:  None. FINDINGS: Intrauterine gestational sac: None Yolk sac:  Not Visualized. Embryo:  Not Visualized. Cardiac Activity: Not Visualized. Heart Rate: Not applicable Subchorionic hemorrhage:  None visualized. Maternal uterus/adnexae: Small amount of free fluid in the cul-de-sac. Hypoechoic thinly septated hemorrhagic appearing cyst measuring 1.8 x 1.4 x 2.1 cm is noted with peripheral vascularity. An additional thinly septated complex cyst of the right ovary measuring 2.2 x 1.5 x 2.3 cm identified. A follicle is noted of the left ovary measuring 1.8 x 1.5 x 1.7 cm. No ectopic pregnancy is seen. IMPRESSION: 1. No intrauterine or ectopic pregnancy is identified. Findings would suggest a pregnancy of unknown origin given positive pregnancy test. Serial HCG and repeat imaging as needed is suggested. 2. Complex right ovarian cysts 1 which appears to be hemorrhagic in appearance measuring 1.8 x  1.4 x 2.1 cm. A thinly septated 2.2 x 1.5 x 2.3 cm right ovarian cyst is also seen without suspicious features. 3. Small amount of free fluid is noted in the pelvis. Electronically Signed   By: Tollie Ethavid  Kwon M.D.   On: 05/17/2017 20:36    MAU Management/MDM: Orders Placed This Encounter  Procedures  . CBC WITH DIFFERENTIAL  . BUN  . AST  . Creatinine, serum  . [facility] [place]    Meds ordered this encounter  Medications  . methotrexate (50 mg/ml) chemo injection 50 mg/m2 (Dosing Weight)    Consult Dr Alysia PennaErvin for notification of MTX being given. Pt discharged with strict ectopic precautions. Discussed in detail plan of care for the next week and discussed needs to return to MAU. Educated on importance on keeping schedule for care and returning to clinic on Day 4: Monday and Day 7: Thursday. Discussed importance of returning to MAU ASAP for increased pain and/or bleeding. Patient is reliable and plans to take off work this week in order to come to appointments and MAU as needed. Answered patient and husband's questions. Patient agrees to POC. Pt discharged after MTX injection. Pt stable at time of discharge.    ASSESSMENT 1. Right tubal pregnancy without intrauterine pregnancy     PLAN Discharge home. Pt stable at time of discharge  Return to clinic on Monday morning at 8:30am for repeat HCG  Return to MAU immediately for increased pain and/or bleeding    Allergies as of 05/25/2017   No Known Allergies     Medication List    STOP taking these medications   ibuprofen 200 MG tablet Commonly known as:  ADVIL,MOTRIN   imiquimod 5 % cream Commonly known as:  ALDARA     TAKE these medications   varenicline 0.5 MG X 11 & 1 MG X 42 tablet Commonly known as:  CHANTIX STARTING MONTH PAK Take one 0.5 mg tablet by mouth once daily for 3 days, then increase to one 0.5 mg tablet twice daily for 4 days, then increase to one 1 mg tablet twice daily. If  need  refill please call.       Steward Drone  Certified Nurse-Midwife 05/25/2017  12:51 PM

## 2017-05-25 NOTE — Discharge Instructions (Signed)
Ectopic Pregnancy °An ectopic pregnancy happens when a fertilized egg grows outside the uterus. A pregnancy cannot live outside of the uterus. This problem often happens in the fallopian tube. It is often caused by damage to the fallopian tube. °If this problem is found early, you may be treated with medicine. If your tube tears or bursts open (ruptures), you will bleed inside. This is an emergency. You will need surgery. Get help right away. °What are the signs or symptoms? °You may have normal pregnancy symptoms at first. These include: °· Missing your period. °· Feeling sick to your stomach (nauseous). °· Being tired. °· Having tender breasts. ° °Then, you may start to have symptoms that are not normal. These include: °· Pain with sex (intercourse). °· Bleeding from the vagina. This includes light bleeding (spotting). °· Belly (abdomen) or lower belly cramping or pain. This may be felt on one side. °· A fast heartbeat (pulse). °· Passing out (fainting) after going poop (bowel movement). ° °If your tube tears, you may have symptoms such as: °· Really bad pain in the belly or lower belly. This happens suddenly. °· Dizziness. °· Passing out. °· Shoulder pain. ° °Get help right away if: °You have any of these symptoms. This is an emergency. °This information is not intended to replace advice given to you by your health care provider. Make sure you discuss any questions you have with your health care provider. °Document Released: 07/21/2008 Document Revised: 09/30/2015 Document Reviewed: 12/04/2012 °Elsevier Interactive Patient Education © 2017 [employer] ° °Methotrexate Treatment for an Ectopic Pregnancy, Care After °Refer to this sheet in the next few weeks. These instructions provide you with information on caring for yourself after your procedure. Your health care provider may also give you more specific instructions. Your treatment has been planned according to current medical practices, but problems sometimes  occur. Call your health care provider if you have any problems or questions after your procedure. °What can I expect after the procedure? °You may have some abdominal cramping, vaginal bleeding, and fatigue in the first few days after taking methotrexate. Some other possible side effects of methotrexate include: °· Nausea. °· Vomiting. °· Diarrhea. °· Mouth sores. °· Swelling or irritation of the lining of your lungs (pneumonitis). °· Liver damage. °· Hair loss. ° °Follow these instructions at home: °After you have received the methotrexate medicine, you need to be careful of your activities and watch your condition for several weeks. It may take 1 week before your hormone levels return to normal. °Activity °· Do not have sexual intercourse until your health care provider says it is safe to do so. °· You may resume your usual diet. °· Limit strenuous activity. °· Do not drink alcohol. °General instructions °· Do not take aspirin, ibuprofen, or naproxen (nonsteroidal anti-inflammatory drugs [NSAIDs]). °· Do not take folic acid, prenatal vitamins, or other vitamins that contain folic acid. °· Avoid traveling too far away from your health care provider. °· Keep all follow-up visits as told by your health care provider. This is important. °Contact a health care provider if: °· You cannot control your nausea and vomiting. °· You cannot control your diarrhea. °· You have sores in your mouth and want treatment. °· You need pain medicine for your abdominal pain. °· You have a rash. °· You are having a reaction to the medicine. °Get help right away if: °· You have increasing abdominal or pelvic pain. °· You notice increased bleeding. °· You feel light-headed, or   you faint. °· You have shortness of breath. °· Your heart rate increases. °· You have a cough. °· You have chills. °· You have a fever. °This information is not intended to replace advice given to you by your health care provider. Make sure you discuss any questions  you have with your health care provider. °Document Released: 04/13/2011 Document Revised: 09/30/2015 Document Reviewed: 02/10/2013 °Elsevier Interactive Patient Education © 2017 [employer] ° °

## 2017-05-25 NOTE — Progress Notes (Addendum)
Rebecca Herring is here for a repeat stat BCHG.  Explained we will draw the bhcg and send stat and have her wait in lobby for results. Then will discuss with provider and with her. She denies pain or bleeding today and states she will have to go to her car briefly for a mandatory conference call for work , but then will come back in lobby.   10:17 Results reviewed with Dr. Erin FullingHarraway-Smith- orders to take to MAU for methotrexate.  Called report to MAU charge RN Jolynn. Called for patient in lobby. Not present.   10:40 Patient in lobby and brought back and explained her levels have stayed about the same last few draws and reccommedation is to go to MAU and get methotrexate. She has some questions. Explained MAU will answer all her questions. She voices agreement with plan after she calls work to notify.   Attestation of Attending Supervision of RN: Evaluation and management procedures were performed by the nurse under my supervision and collaboration.  I have reviewed the nursing note and chart, and I agree with the management and plan.  Carolyn L. Harraway-Smith, M.D., Evern CoreFACOG

## 2017-05-25 NOTE — MAU Note (Signed)
[facility], did not know she was preg. Has had follow up at clinic. Sent to MAU for Methotrexate.  Labs have been drawn, denies any pain or bleeding at this time.

## 2017-05-28 ENCOUNTER — Ambulatory Visit: Payer: PRIVATE HEALTH INSURANCE | Admitting: General Practice

## 2017-05-28 DIAGNOSIS — O00101 Right tubal pregnancy without intrauterine pregnancy: Secondary | ICD-10-CM

## 2017-05-28 LAB — HCG, QUANTITATIVE, PREGNANCY: HCG, BETA CHAIN, QUANT, S: 578 m[IU]/mL — AB (ref <5)

## 2017-05-28 NOTE — Progress Notes (Signed)
Patient here for stat bhcg, day #4 labs following MTX. Patient reports pelvic cramping/pressure and small amount of bleeding. Discussed with patient we are monitoring the trend of her bhcg, pregnancy hormone, levels today. Asked she wait in lobby for results & updated plan of care. Patient verbalized understanding & had no questions at this time.  Reviewed results with Dr Adrian BlackwaterStinson who finds slight increase in bhcg levels, patient should follow up on Thursday for day #7 labs. Informed patient of results & plan for repeat stat on Thursday. Patient verbalized understanding to all & had no questions at this time

## 2017-05-31 ENCOUNTER — Ambulatory Visit (INDEPENDENT_AMBULATORY_CARE_PROVIDER_SITE_OTHER): Payer: PRIVATE HEALTH INSURANCE | Admitting: General Practice

## 2017-05-31 DIAGNOSIS — O00109 Unspecified tubal pregnancy without intrauterine pregnancy: Secondary | ICD-10-CM

## 2017-05-31 LAB — HCG, QUANTITATIVE, PREGNANCY: hCG, Beta Chain, Quant, S: 424 m[IU]/mL — ABNORMAL HIGH (ref <5)

## 2017-05-31 NOTE — Progress Notes (Signed)
Patient here for stat bhcg, day #7 labs following MTX. Patient reports mild cramping yesterday, but no pain or bleeding today. Discussed with patient we are monitoring her bhcg levels and asked she wait in lobby for results/updated plan of care. Patient verbalized understanding and had no questions at this time.  Reviewed patient's labs with Dr Alysia PennaErvin who finds decreasing bhcg levels reassuring. Patient should have follow up bhcg next week.   Informed patient of results & recommendation. Patient verbalized understanding and had no questions

## 2017-06-04 ENCOUNTER — Other Ambulatory Visit: Payer: PRIVATE HEALTH INSURANCE

## 2017-06-04 DIAGNOSIS — O00109 Unspecified tubal pregnancy without intrauterine pregnancy: Secondary | ICD-10-CM

## 2017-06-05 LAB — BETA HCG QUANT (REF LAB): HCG QUANT: 157 m[IU]/mL

## 2018-05-05 ENCOUNTER — Encounter (HOSPITAL_COMMUNITY): Payer: Self-pay

## 2019-07-30 IMAGING — US US OB COMP LESS 14 WK
1 series · 13 of 28 positions shown · non-contrast
Comparison: None.

CLINICAL DATA: Irregular bleeding with positive pregnancy test.
Beta HCG 455

EXAM:
OBSTETRIC <14 WK US AND TRANSVAGINAL OB US
TECHNIQUE: Both transabdominal and transvaginal ultrasound examinations were
performed for complete evaluation of the gestation as well as the
maternal uterus, adnexal regions, and pelvic cul-de-sac.
Transvaginal technique was performed to assess early pregnancy.

[Series 1: us ob comp less 14 wk · 0.17mm/px · 13 of 67 slices shown]
[im 3/67]
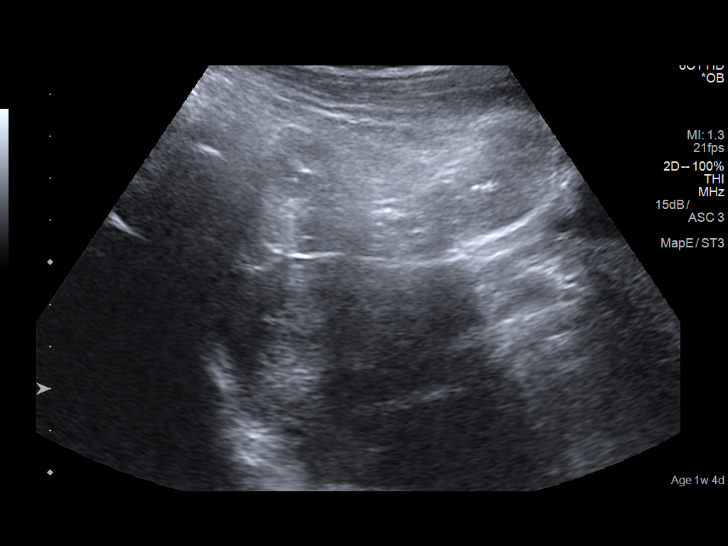
[im 8/67]
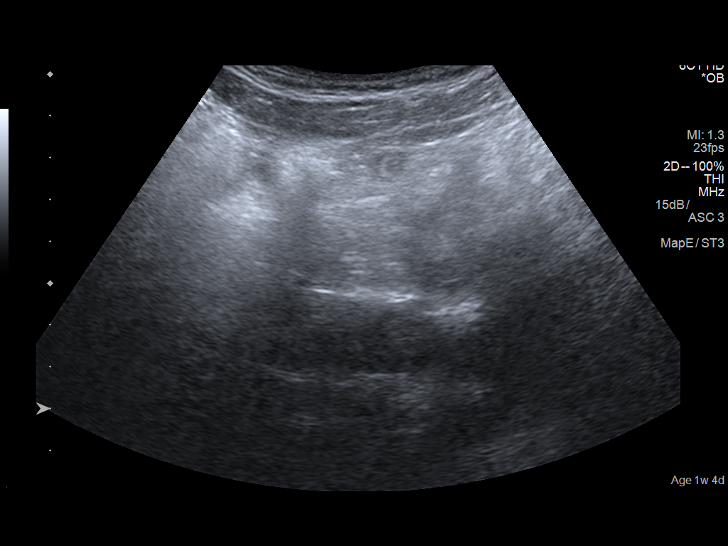
[im 13/67]
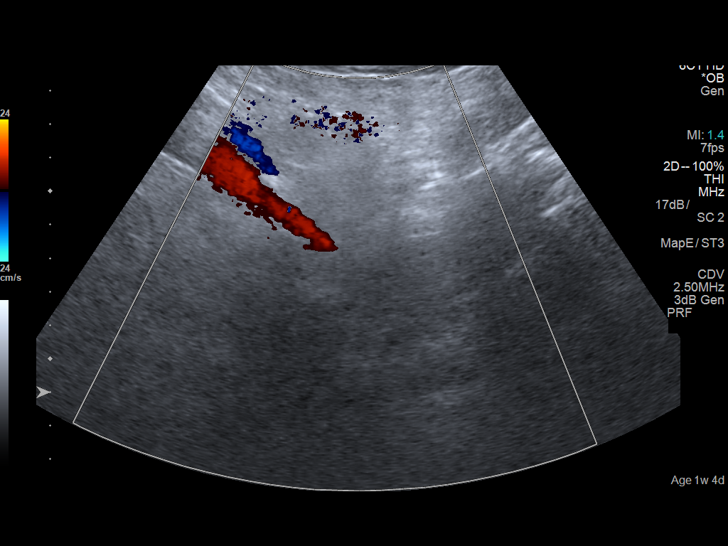
[im 18/67]
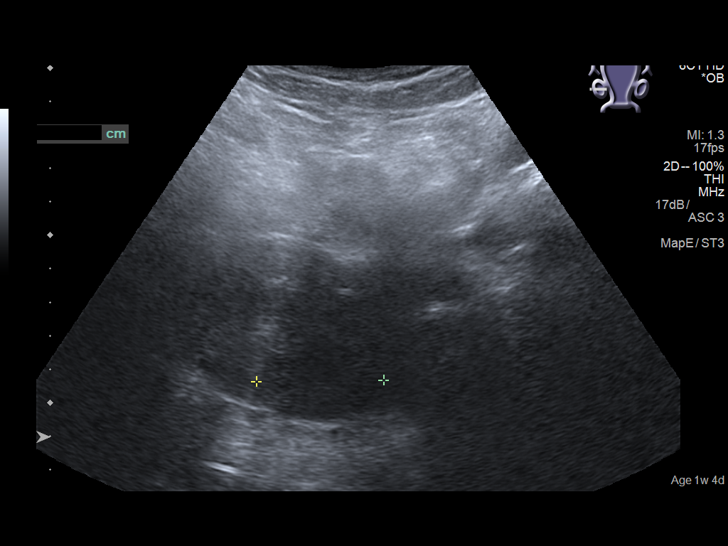
[im 23/67]
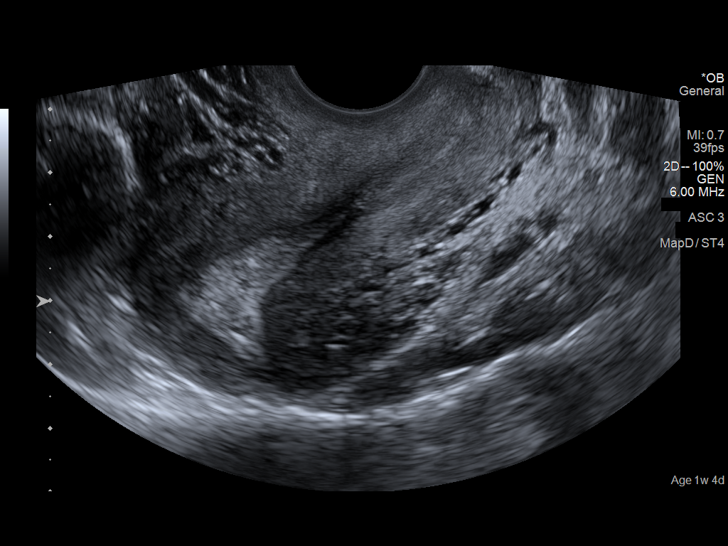
[im 27/67]
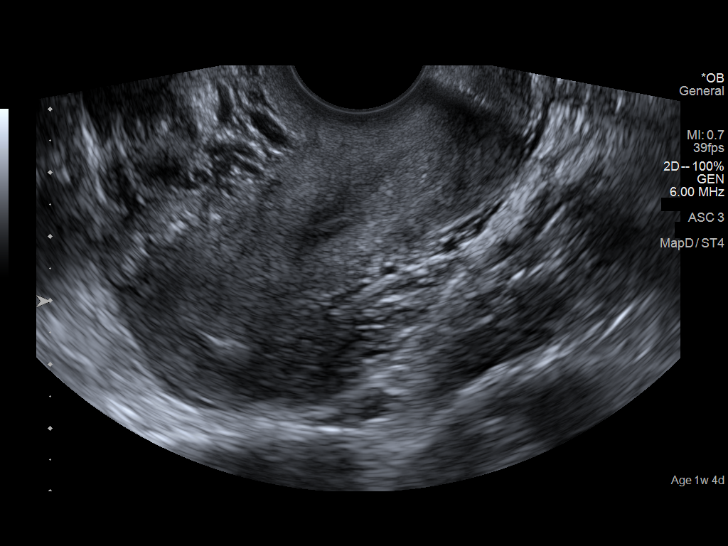
[im 35/67]
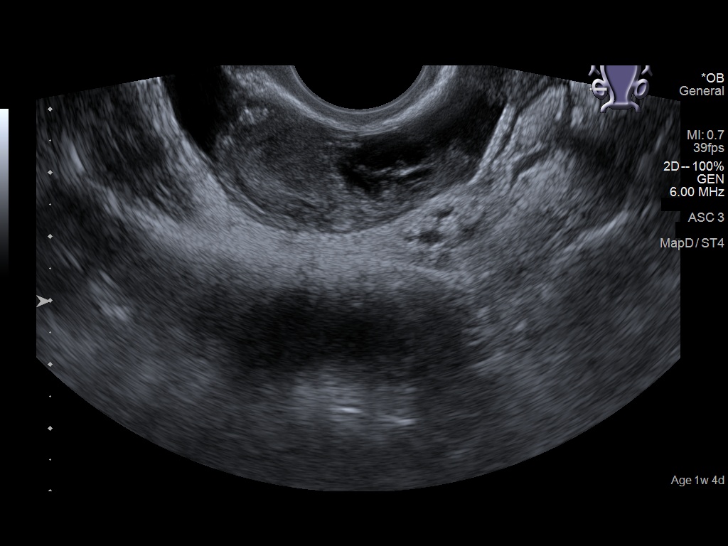
[im 40/67]
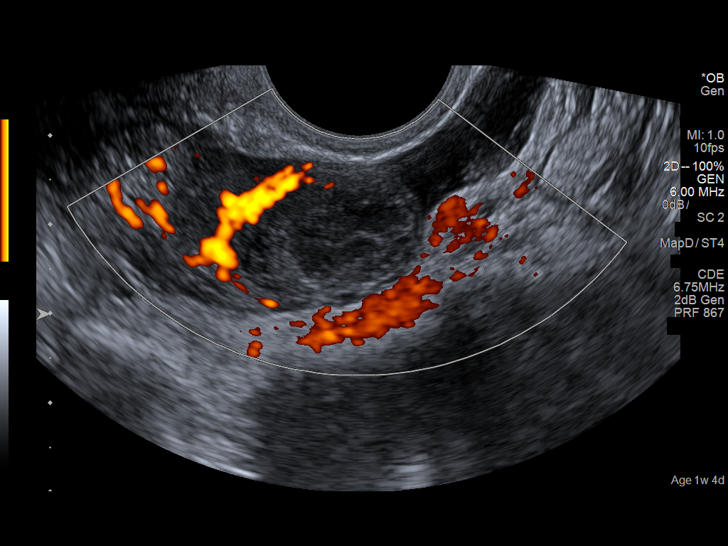
[im 45/67]
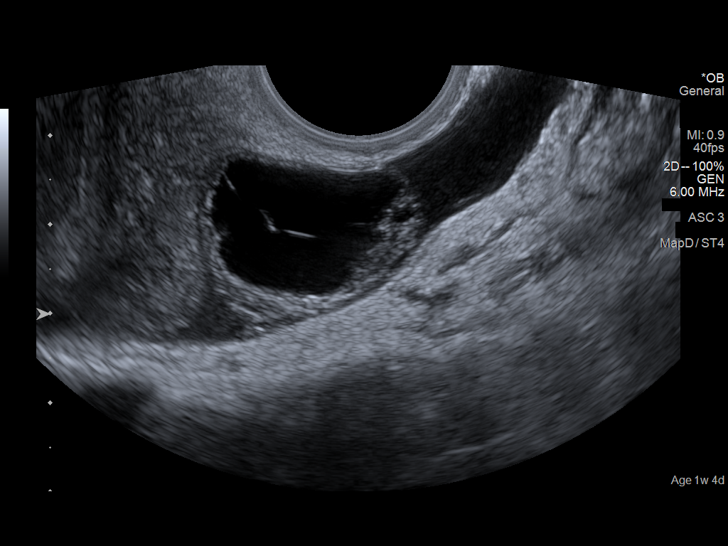
[im 49/67]
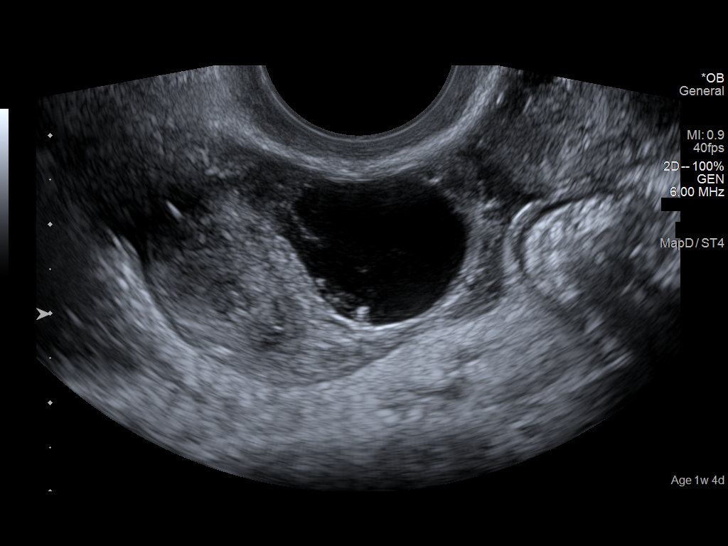
[im 54/67]
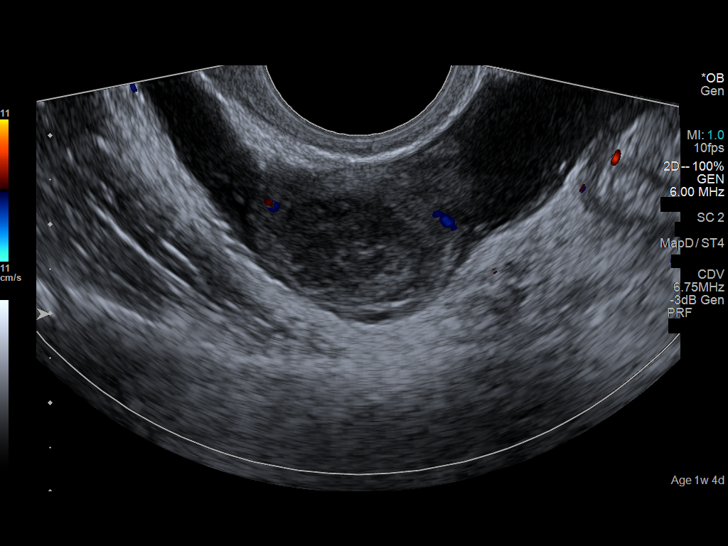
[im 59/67]
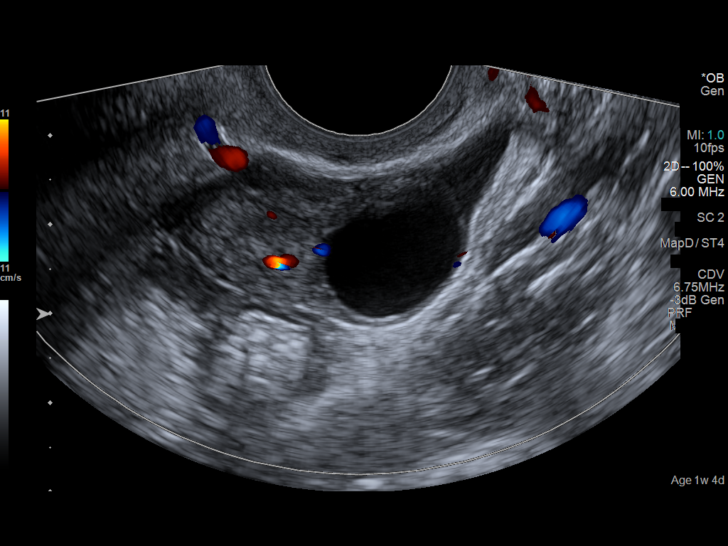
[im 64/67]
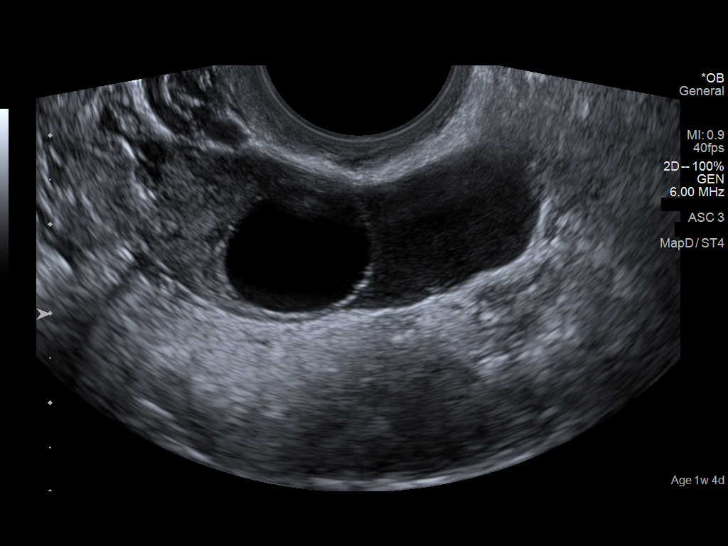

[13 of 28 positions shown; findings below may reference images not displayed]

FINDINGS: Intrauterine gestational sac: None

Yolk sac:  Not Visualized.

Embryo:  Not Visualized.

Cardiac Activity: Not Visualized.

Heart Rate: Not applicable

Subchorionic hemorrhage:  None visualized.

Maternal uterus/adnexae: Small amount of free fluid in the
cul-de-sac. Hypoechoic thinly septated hemorrhagic appearing cyst
measuring 1.8 x 1.4 x 2.1 cm is noted with peripheral vascularity.
An additional thinly septated complex cyst of the right ovary
measuring 2.2 x 1.5 x 2.3 cm identified. A follicle is noted of the
left ovary measuring 1.8 x 1.5 x 1.7 cm. No ectopic pregnancy is
seen.
IMPRESSION: 1. No intrauterine or ectopic pregnancy is identified. Findings
would suggest a pregnancy of unknown origin given positive pregnancy
test. Serial HCG and repeat imaging as needed is suggested.
2. Complex right ovarian cysts 1 which appears to be hemorrhagic in
appearance measuring 1.8 x 1.4 x 2.1 cm. A thinly septated 2.2 x
x 2.3 cm right ovarian cyst is also seen without suspicious
features.
3. Small amount of free fluid is noted in the pelvis.

## 2024-06-08 DEATH — deceased
